# Patient Record
Sex: Male | Born: 1980 | Race: White | Hispanic: No | Marital: Single | Smoking: Former smoker
Health system: Southern US, Community
[De-identification: ages and names within clinical notes are randomized; demographics above are authoritative.]

## PROBLEM LIST (undated history)

## (undated) DIAGNOSIS — F419 Anxiety disorder, unspecified: Secondary | ICD-10-CM

## (undated) HISTORY — DX: Anxiety disorder, unspecified: F41.9

---

## 2011-07-20 ENCOUNTER — Encounter (HOSPITAL_COMMUNITY): Payer: Self-pay | Admitting: Emergency Medicine

## 2011-07-20 ENCOUNTER — Emergency Department (HOSPITAL_COMMUNITY): Payer: Self-pay

## 2011-07-20 ENCOUNTER — Emergency Department (HOSPITAL_COMMUNITY)
Admission: EM | Admit: 2011-07-20 | Discharge: 2011-07-20 | Disposition: A | Discharge status: Home / Self Care | Payer: Self-pay | Attending: Emergency Medicine | Admitting: Emergency Medicine

## 2011-07-20 ENCOUNTER — Emergency Department (HOSPITAL_COMMUNITY)
Admission: EM | Admit: 2011-07-20 | Discharge: 2011-07-20 | Disposition: A | Discharge status: Home Health | Payer: Self-pay | Attending: Emergency Medicine | Admitting: Emergency Medicine

## 2011-07-20 ENCOUNTER — Encounter (HOSPITAL_COMMUNITY): Payer: Self-pay

## 2011-07-20 DIAGNOSIS — R51 Headache: Secondary | ICD-10-CM | POA: Insufficient documentation

## 2011-07-20 DIAGNOSIS — S0003XA Contusion of scalp, initial encounter: Secondary | ICD-10-CM | POA: Insufficient documentation

## 2011-07-20 DIAGNOSIS — S0083XA Contusion of other part of head, initial encounter: Secondary | ICD-10-CM | POA: Insufficient documentation

## 2011-07-20 DIAGNOSIS — W503XXA Accidental bite by another person, initial encounter: Secondary | ICD-10-CM | POA: Insufficient documentation

## 2011-07-20 DIAGNOSIS — F17200 Nicotine dependence, unspecified, uncomplicated: Secondary | ICD-10-CM | POA: Insufficient documentation

## 2011-07-20 DIAGNOSIS — R569 Unspecified convulsions: Secondary | ICD-10-CM | POA: Insufficient documentation

## 2011-07-20 DIAGNOSIS — M25519 Pain in unspecified shoulder: Secondary | ICD-10-CM | POA: Insufficient documentation

## 2011-07-20 LAB — CBC
Hemoglobin: 15.7 g/dL (ref 13.0–17.0)
MCH: 29.6 pg (ref 26.0–34.0)
RBC: 5.3 MIL/uL (ref 4.22–5.81)
WBC: 5.4 10*3/uL (ref 4.0–10.5)

## 2011-07-20 LAB — BASIC METABOLIC PANEL
BUN: 13 mg/dL (ref 6–23)
CO2: 20 mEq/L (ref 19–32)
Chloride: 100 mEq/L (ref 96–112)
GFR calc non Af Amer: >90 mL/min (ref >90)
Glucose, Bld: 205 mg/dL — ABNORMAL HIGH (ref 70–99)
Potassium: 4.7 mEq/L (ref 3.5–5.1)

## 2011-07-20 LAB — RAPID URINE DRUG SCREEN, HOSP PERFORMED
Amphetamines: NOT DETECTED
Cocaine: NOT DETECTED
Opiates: NOT DETECTED
Tetrahydrocannabinol: NOT DETECTED

## 2011-07-20 LAB — URINE MICROSCOPIC-ADD ON

## 2011-07-20 LAB — DIFFERENTIAL
Eosinophils Absolute: 0 10*3/uL (ref 0.0–0.7)
Lymphocytes Relative: 27 % (ref 12–46)
Lymphs Abs: 1.5 10*3/uL (ref 0.7–4.0)
Monocytes Relative: 5 % (ref 3–12)
Neutrophils Relative %: 68 % (ref 43–77)

## 2011-07-20 LAB — URINALYSIS, ROUTINE W REFLEX MICROSCOPIC
Bilirubin Urine: NEGATIVE
Glucose, UA: NEGATIVE mg/dL
Specific Gravity, Urine: 1.03 (ref 1.005–1.030)
pH: 5.5 (ref 5.0–8.0)

## 2011-07-20 MED ORDER — SODIUM CHLORIDE 0.9 % IV BOLUS (SEPSIS)
1000.0000 mL | Freq: Once | INTRAVENOUS | Status: AC
  Administered 2011-07-20: 1000 mL via INTRAVENOUS

## 2011-07-20 MED ORDER — LORAZEPAM 1 MG PO TABS
2.0000 mg | ORAL_TABLET | Freq: Once | ORAL | Status: AC
  Administered 2011-07-20: 2 mg via ORAL
  Filled 2011-07-20: qty 1

## 2011-07-20 MED ORDER — LORAZEPAM 1 MG PO TABS: 2.0000 mg | ORAL_TABLET | Freq: Three times a day (TID) | ORAL | Status: AC

## 2011-07-20 MED ORDER — SODIUM CHLORIDE 0.9 % IV SOLN
Freq: Once | INTRAVENOUS | Status: AC
  Administered 2011-07-20: 1000 mL via INTRAVENOUS

## 2011-07-20 NOTE — ED Notes (Signed)
Pt just d/c from ED, back again due to another seizure. Per ems, pt was postictal and altered loc that lasted a few minutes.  Pt is alert/oriented and does not appear postictal at this time. No fall. Pt was on cough. Bite mark to tongue noted but from previous visit. Pt c/o pain to posterior L shoulder from previous visit also. No fall on previous visit either. States does not know what happened to his arm. Seizure pads placed.

## 2011-07-20 NOTE — ED Notes (Signed)
Pt states he and his dad were cooking breakfast and the next think he knew EMS was there. Per EMS, they were told by pt's dad pt has a " full body seizure " that lasted about 3 min. Pt alert and oriented x 3 on arrival to ED. Area noted to right of pt's tongue that pt chewed

## 2011-07-20 NOTE — ED Provider Notes (Signed)
History   This chart was scribed for Alexander Jakes, MD by Clarita Crane. The patient was seen in room APA15/APA15. Patient's care was started at 0907.    CSN: [id number]  Arrival date & time 07/20/11  0907   First MD Initiated Contact with Patient 07/20/11 [phone]      Chief Complaint  Patient presents with  . Seizures    (Consider location/radiation/quality/duration/timing/severity/associated sxs/prior treatment) HPI Alexander Richmond is a 31 y.o. male who presents to the Emergency Department for evaluation following a moderate to severe episode of a generalized seizure this morning while eating breakfast, approximately 1 hour ago, which has currently resolved. Patient is amnestic to events following onset of seizure.  Patient's father reports patient's seizure lasted approximately 3 minutes before resolving on its own. Father notes patient returned to baseline 5-10 minutes following end of seizure. Patient notes biting tongue during seizure and currently c/o moderate HA. Denies associated vomiting, incontinence.  History reviewed. No pertinent past medical history.  History reviewed. No pertinent past surgical history.  No family history on file.  History  Substance Use Topics  . Smoking status: Current Everyday Smoker    Types: Cigarettes  . Smokeless tobacco: Not on file  . Alcohol Use: No      Review of Systems  Constitutional: Negative for fever and chills.  HENT: Negative for rhinorrhea and neck pain.        Tongue Biting  Eyes: Negative for pain.  Respiratory: Negative for cough and shortness of breath.   Cardiovascular: Negative for chest pain.  Gastrointestinal: Negative for nausea, vomiting, abdominal pain and diarrhea.  Genitourinary: Negative for dysuria.  Musculoskeletal: Negative for back pain.  Skin: Negative for rash.  Neurological: Positive for seizures and headaches. Negative for dizziness and weakness.    Allergies  Review of patient's allergies  indicates no known allergies.  Home Medications  No current outpatient prescriptions on file.  BP 132/82  Pulse 90  Temp(Src) 98.2 F (36.8 C) (Oral)  Resp 20  Ht 5\' 6"  (1.676 m)  Wt 165 lb (74.844 kg)  BMI 26.63 kg/m2  SpO2 96%  Physical Exam  Nursing note and vitals reviewed. Constitutional: He is oriented to person, place, and time. He appears well-developed and well-nourished. No distress.  HENT:  Head: Normocephalic and atraumatic.  Mouth/Throat: Oropharynx is clear and moist.       Evidence of tongue biting.   Eyes: EOM are normal. Pupils are equal, round, and reactive to light.  Neck: Neck supple. No tracheal deviation present.  Cardiovascular: Normal rate and regular rhythm.  Exam reveals no gallop and no friction rub.   No murmur heard. Pulmonary/Chest: Effort normal. No respiratory distress. He has no wheezes. He has no rales.  Abdominal: Soft. Bowel sounds are normal. He exhibits no distension. There is no tenderness.  Musculoskeletal: Normal range of motion. He exhibits no edema.  Neurological: He is alert and oriented to person, place, and time. No cranial nerve deficit or sensory deficit.       No pronator drift.   Skin: Skin is warm and dry.  Psychiatric: He has a normal mood and affect. His behavior is normal.    ED Course  Procedures (including critical care time)  DIAGNOSTIC STUDIES: Oxygen Saturation is 96% on room air, normal by my interpretation.    COORDINATION OF CARE: 9:57AM- Patient informed of current plan for treatment and evaluation and agrees with plan at this time.     Results for orders placed  during the hospital encounter of 07/20/11  CBC      Component Value Range   WBC 5.4  4.0 - 10.5 (K/uL)   RBC 5.30  4.22 - 5.81 (MIL/uL)   Hemoglobin 15.7  13.0 - 17.0 (g/dL)   HCT 46.9  62.9 - 52.8 (%)   MCV 87.7  78.0 - 100.0 (fL)   MCH 29.6  26.0 - 34.0 (pg)   MCHC 33.8  30.0 - 36.0 (g/dL)   RDW 41.3  24.4 - 01.0 (%)   Platelets 242  150  - 400 (K/uL)  DIFFERENTIAL      Component Value Range   Neutrophils Relative 68  43 - 77 (%)   Neutro Abs 3.7  1.7 - 7.7 (K/uL)   Lymphocytes Relative 27  12 - 46 (%)   Lymphs Abs 1.5  0.7 - 4.0 (K/uL)   Monocytes Relative 5  3 - 12 (%)   Monocytes Absolute 0.3  0.1 - 1.0 (K/uL)   Eosinophils Relative 0  0 - 5 (%)   Eosinophils Absolute 0.0  0.0 - 0.7 (K/uL)   Basophils Relative 0  0 - 1 (%)   Basophils Absolute 0.0  0.0 - 0.1 (K/uL)  BASIC METABOLIC PANEL      Component Value Range   Sodium 137  135 - 145 (mEq/L)   Potassium 4.7  3.5 - 5.1 (mEq/L)   Chloride 100  96 - 112 (mEq/L)   CO2 20  19 - 32 (mEq/L)   Glucose, Bld 205 (*) 70 - 99 (mg/dL)   BUN 13  6 - 23 (mg/dL)   Creatinine, Ser 2.72  0.50 - 1.35 (mg/dL)   Calcium 9.9  8.4 - 53.6 (mg/dL)   GFR calc non Af Amer >90  >90 (mL/min)   GFR calc Af Amer >90  >90 (mL/min)  URINALYSIS, ROUTINE W REFLEX MICROSCOPIC      Component Value Range   Color, Urine YELLOW  YELLOW    APPearance CLEAR  CLEAR    Specific Gravity, Urine 1.030  1.005 - 1.030    pH 5.5  5.0 - 8.0    Glucose, UA NEGATIVE  NEGATIVE (mg/dL)   Hgb urine dipstick SMALL (*) NEGATIVE    Bilirubin Urine NEGATIVE  NEGATIVE    Ketones, ur TRACE (*) NEGATIVE (mg/dL)   Protein, ur 30 (*) NEGATIVE (mg/dL)   Urobilinogen, UA 0.2  0.0 - 1.0 (mg/dL)   Nitrite NEGATIVE  NEGATIVE    Leukocytes, UA NEGATIVE  NEGATIVE   URINE MICROSCOPIC-ADD ON      Component Value Range   RBC / HPF 3-6  <3 (RBC/hpf)    Ct Head Wo Contrast  07/20/2011  *RADIOLOGY REPORT*  Clinical Data: 31 year old male with seizure.  CT HEAD WITHOUT CONTRAST  Technique:  Contiguous axial images were obtained from the base of the skull through the vertex without contrast.  Comparison: None.  Findings: Visualized paranasal sinuses and mastoids are clear.  No acute osseous abnormality identified.  The Visualized orbits and scalp soft tissues are within normal limits.  Cerebral volume is within normal limits  for age.  No midline shift, ventriculomegaly, mass effect, evidence of mass lesion, intracranial hemorrhage or evidence of cortically based acute infarction.  Gray-white matter differentiation is within normal limits throughout the brain.  No suspicious intracranial vascular hyperdensity.  IMPRESSION: Normal noncontrast CT appearance of the brain.  Original Report Authenticated By: Harley Hallmark, M.D.     1. Seizure  MDM  By history sounds as if he did truly have a generalized seizure no past history of seizures. Today's head CT and workup negative without any specific findings. Patient will need followup with neurology in the next few days for further workup.      I personally performed the services described in this documentation, which was scribed in my presence. The recorded information has been reviewed and considered.     Alexander Jakes, MD 07/20/11 1150

## 2011-07-20 NOTE — Discharge Instructions (Signed)
Driving and Equipment Restrictions Some medical problems make it dangerous to drive, ride a bike, or use machines. Some of these problems are:  A hard blow to the head (concussion).   Passing out (fainting).   Twitching and shaking (seizures).   Low blood sugar.   Taking medicine to help you relax (sedatives).   Taking pain medicines.   Wearing an eye patch.   Wearing splints. This can make it hard to use parts of your body that you need to drive safely.  HOME CARE   Do not drive until your doctor says it is okay.   Do not use machines until your doctor says it is okay.  You may need a form signed by your doctor (medical release) before you can drive again. You may also need this form before you do other tasks where you need to be fully alert. MAKE SURE YOU:  Understand these instructions.   Will watch your condition.   Will get help right away if you are not doing well or get worse.  Document Released: 06/23/2004 Document Revised: 01/26/2011 Document Reviewed: 09/23/2009 Exit[employer] Patient Information 2012 [employer]. 

## 2011-07-20 NOTE — ED Notes (Signed)
Pt reports was sitting in living room eating breakfast and had seizure.  Pt bit r side of tongue.  Pt denies any seizures in the past.  Pt presently alert and oriented.  Pt c/o slight headache and dry mouth.

## 2011-07-20 NOTE — ED Provider Notes (Signed)
History     CSN: [id number]  Arrival date & time 07/20/11  1505   First MD Initiated Contact with Patient 07/20/11 1520      Chief Complaint  Patient presents with  . Seizures     HPI Pt just d/c from ED, back again due to another seizure. Per ems, pt was postictal and altered loc that lasted a few minutes. Pt is alert/oriented and does not appear postictal at this time. No fall. Pt was on cough. Bite mark to tongue noted but from previous visit. Pt c/o pain to posterior L shoulder from previous visit also. No fall on previous visit either. States does not know what happened to his arm. Seizure pads placed.   History reviewed. No pertinent past medical history.  History reviewed. No pertinent past surgical history.  History reviewed. No pertinent family history.  History  Substance Use Topics  . Smoking status: Current Everyday Smoker    Types: Cigarettes  . Smokeless tobacco: Not on file  . Alcohol Use: No      Review of Systems  All other systems reviewed and are negative.    Allergies  Review of patient's allergies indicates no known allergies.  Home Medications   Current Outpatient Rx  Name Route Sig Dispense Refill  . LORAZEPAM 1 MG PO TABS Oral Take 2 tablets (2 mg total) by mouth every 8 (eight) hours. 30 tablet 0    BP 130/66  Pulse 111  Temp(Src) 99.6 F (37.6 C) (Oral)  Resp 20  SpO2 97%  Physical Exam  Nursing note and vitals reviewed. Constitutional: He is oriented to person, place, and time. He appears well-developed and well-nourished. No distress.  HENT:  Head: Normocephalic and atraumatic.  Mouth/Throat: Oropharynx is clear and moist.       Evidence of tongue biting.   Eyes: EOM are normal. Pupils are equal, round, and reactive to light.  Neck: Neck supple. No tracheal deviation present.  Cardiovascular: Normal rate and regular rhythm.  Exam reveals no gallop and no friction rub.   No murmur heard. Pulmonary/Chest: Effort normal. No  respiratory distress. He has no wheezes. He has no rales.  Abdominal: Soft. Bowel sounds are normal. He exhibits no distension. There is no tenderness.  Musculoskeletal: Normal range of motion. He exhibits no edema.  Neurological: He is alert and oriented to person, place, and time. No cranial nerve deficit or sensory deficit.       No pronator drift.   Skin: Skin is warm and dry.  Psychiatric: He has a normal mood and affect. His behavior is normal.    ED Course  Procedures (including critical care time)  Labs Reviewed  URINE RAPID DRUG SCREEN (HOSP PERFORMED) - Abnormal; Notable for the following:    Benzodiazepines POSITIVE (*)    All other components within normal limits  LAB REPORT - SCANNED   Ct Head Wo Contrast  07/20/2011  *RADIOLOGY REPORT*  Clinical Data: 31 year old male with seizure.  CT HEAD WITHOUT CONTRAST  Technique:  Contiguous axial images were obtained from the base of the skull through the vertex without contrast.  Comparison: None.  Findings: Visualized paranasal sinuses and mastoids are clear.  No acute osseous abnormality identified.  The Visualized orbits and scalp soft tissues are within normal limits.  Cerebral volume is within normal limits for age.  No midline shift, ventriculomegaly, mass effect, evidence of mass lesion, intracranial hemorrhage or evidence of cortically based acute infarction.  Gray-white matter differentiation is within normal  limits throughout the brain.  No suspicious intracranial vascular hyperdensity.  IMPRESSION: Normal noncontrast CT appearance of the brain.  Original Report Authenticated By: Harley Hallmark, M.D.     1. Seizure       MDM  The patient was adamant about not being admitted to the hospital.  I spoke with Dr. Judithann Sheen who recommended he be started on benzodiazepines and that he would see him this week for follow up.        Nelia Shi, MD 07/21/11 [phone]

## 2011-07-20 NOTE — Discharge Instructions (Signed)
Epilepsy People with epilepsy have times when they shake and jerk uncontrollably (seizures). This happens when there is a sudden change in brain function. Epilepsy may have many possible causes. Anything that disturbs the normal pattern of brain cell activity can lead to seizures. HOME CARE   Listen to your doctor about driving and safety during normal activities.   Only take medicine as told by your doctor.   Take blood tests as told by your doctor.   Tell the people you live and work with that you have seizures. Make sure they know how to help you. They should:   Cushion your head and body.   Turn you on your side.   Not restrain you.   Not place anything inside your mouth.   Call for local emergency medical help if there is any question about what has happened.   Write down when your seizures happen and what you remember about each seizure. Write down anything you think may have caused the seizure to happen (trigger).   Keep all follow-up visits with your doctor. This is very important.  GET HELP RIGHT AWAY IF:   You get an infection or start to feel sick. You may have more seizures when you are sick.   You are having seizures more often.   Your seizure pattern is changing.   A seizure does not stop after a few seconds or minutes.   A seizure causes you to have trouble breathing.   A seizure gives you a very bad headache.   A seizure makes you unable to speak or use a part of your body.  MAKE SURE YOU:   Understand these instructions.   Will watch your condition.   Will get help right away if you are not doing well or get worse.  Document Released: 03/13/2009 Document Revised: 01/26/2011 Document Reviewed: 03/13/2009 ExitCare Patient Information 2012 [place], [place].  : Neurology for followup. It is important that you follow up with them so they can do EEG studies and MRI and to determine if you need to be treated with antiseizure meds. Return for recurrent  seizure new or worse symptoms.

## 2012-11-20 IMAGING — CT CT HEAD W/O CM
1 series · 16 of 30 positions shown, 20 images · non-contrast
Comparison: None.

CLINICAL DATA: 30-year-old male with seizure.

CT HEAD WITHOUT CONTRAST
TECHNIQUE: Contiguous axial images were obtained from the base of
the skull through the vertex without contrast.

[Series 2: headtrauma 4.8 h37s · axial · 0.43mm/px · z∈[+49,+177]mm · 16 of 30 slices shown, 20 images]
[im 2/30  brain]
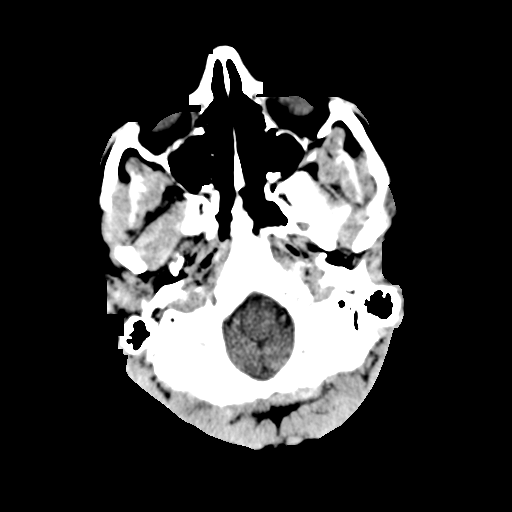
[im 2/30  bone]
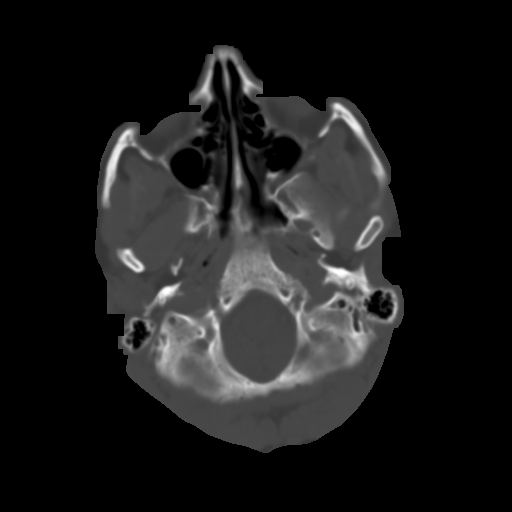
[im 4/30  brain]
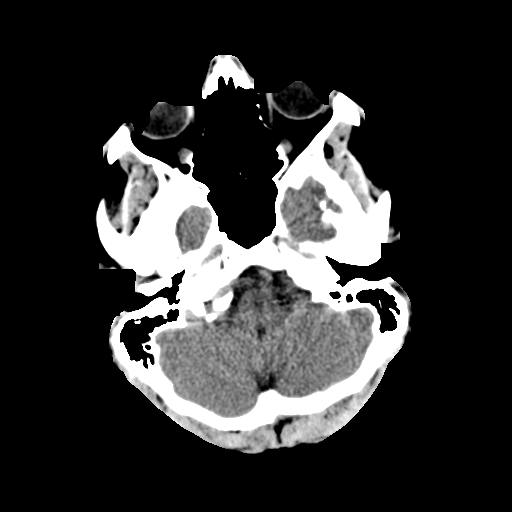
[im 6/30  brain]
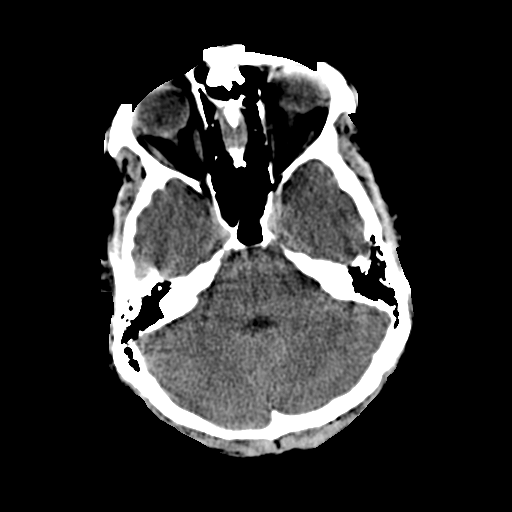
[im 8/30  brain]
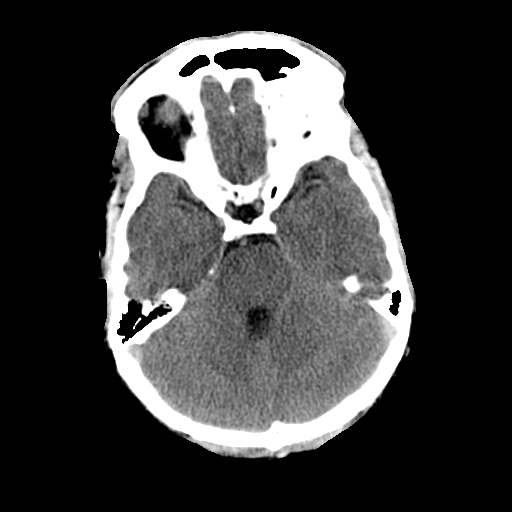
[im 9/30  brain]
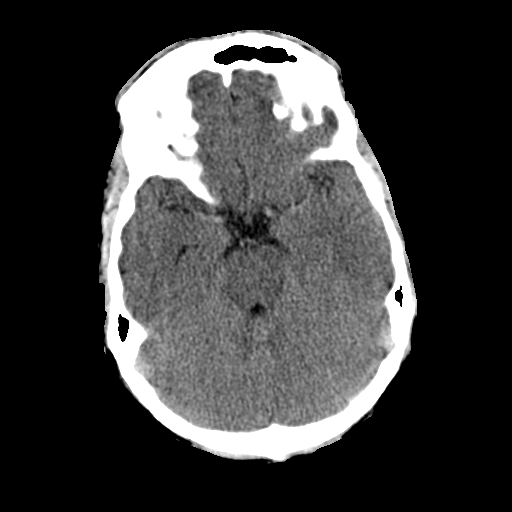
[im 9/30  bone]
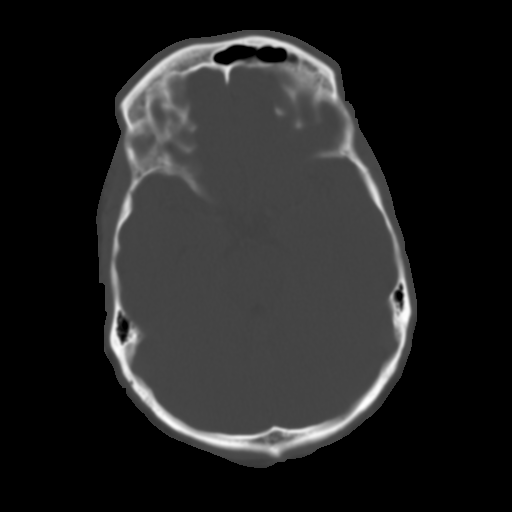
[im 11/30  brain]
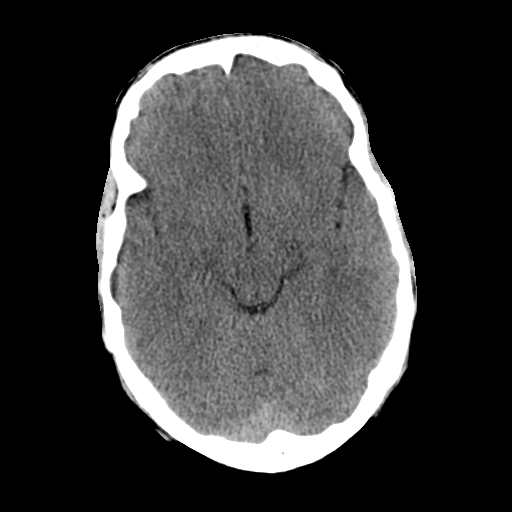
[im 13/30  brain]
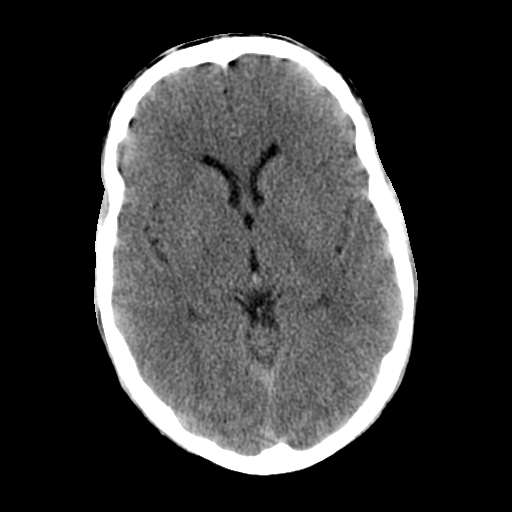
[im 15/30  brain]
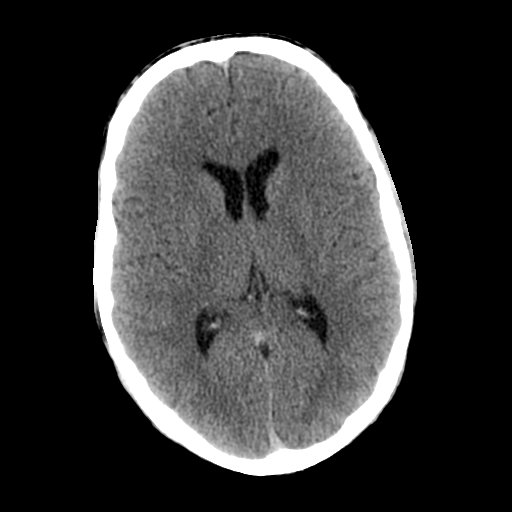
[im 16/30  brain]
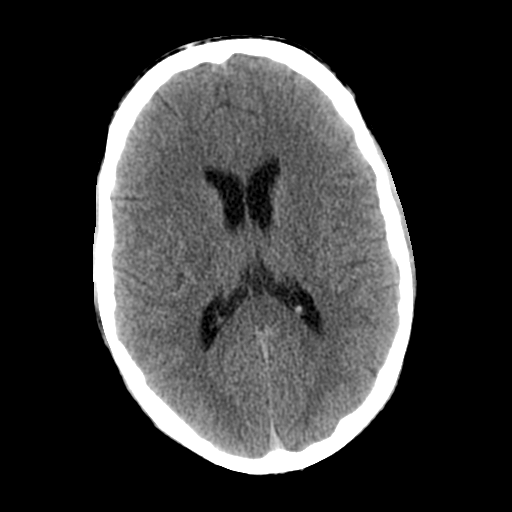
[im 16/30  bone]
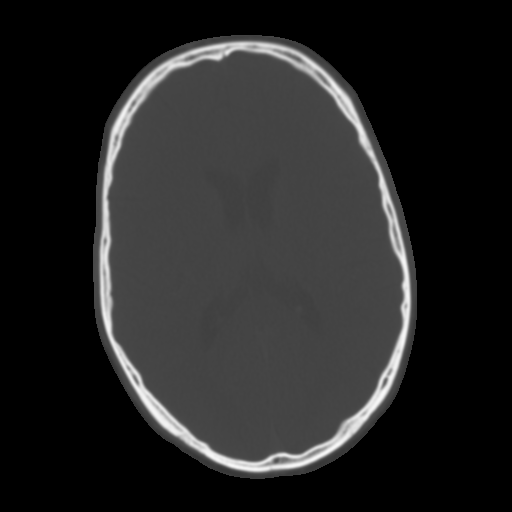
[im 18/30  brain]
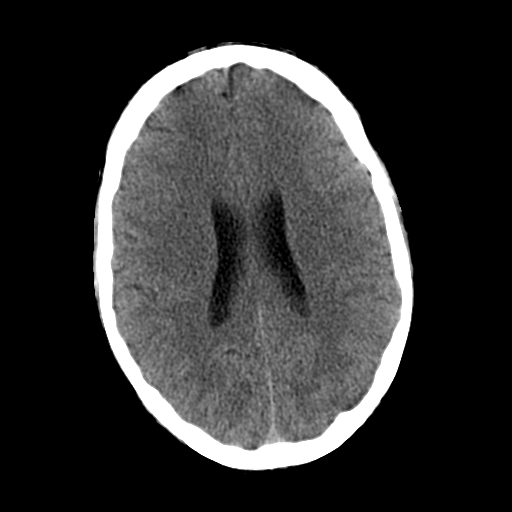
[im 20/30  brain]
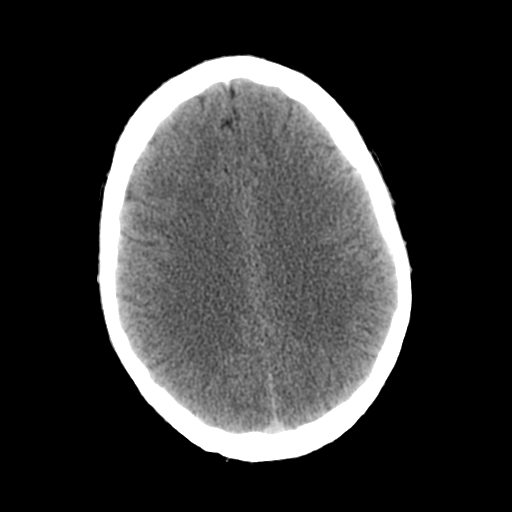
[im 22/30  brain]
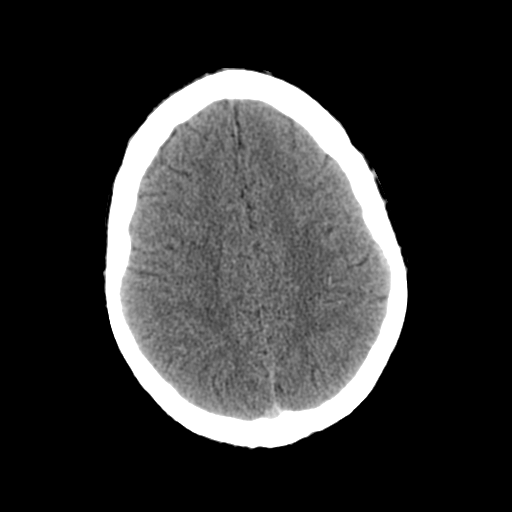
[im 23/30  brain]
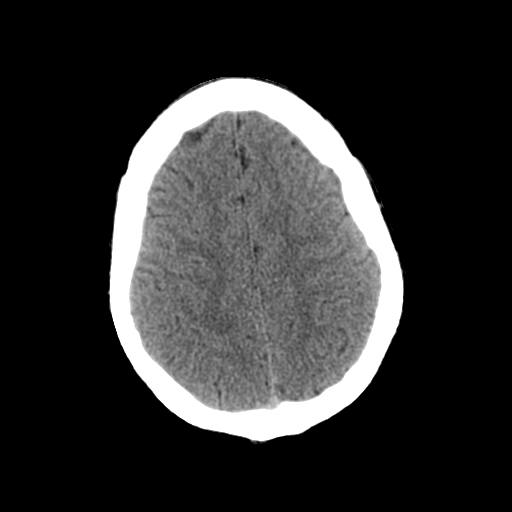
[im 23/30  bone]
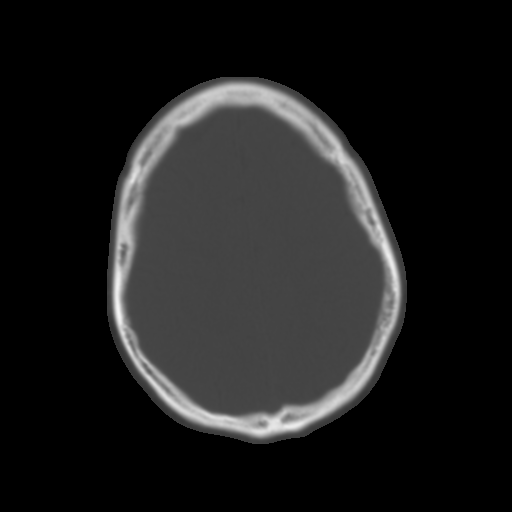
[im 25/30  brain]
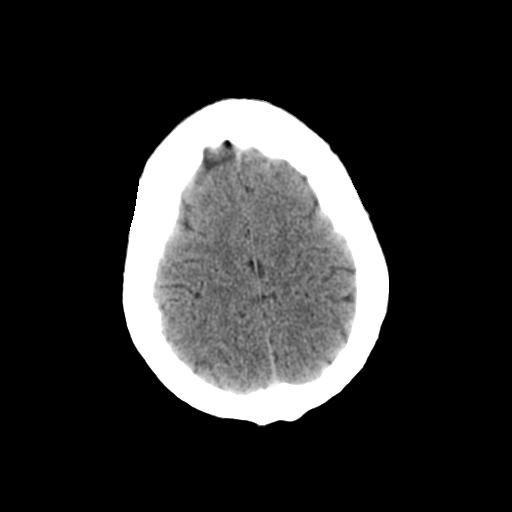
[im 27/30  brain]
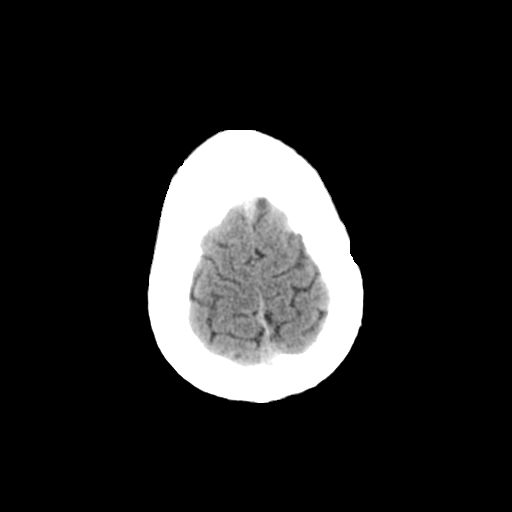
[im 29/30  brain]
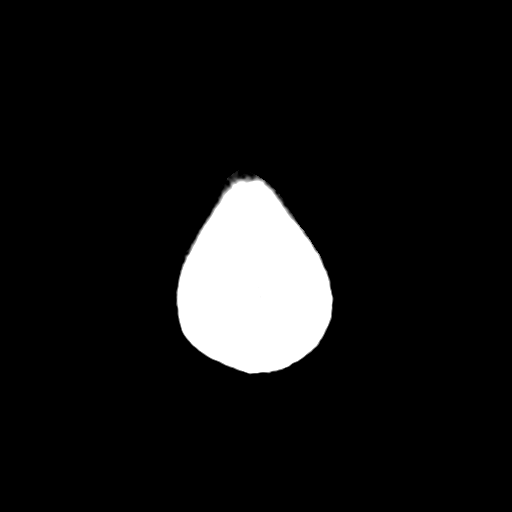

[16 of 30 positions shown; findings below may reference images not displayed]

FINDINGS: Visualized paranasal sinuses and mastoids are clear.  No
acute osseous abnormality identified.  The Visualized orbits and
scalp soft tissues are within normal limits.

Cerebral volume is within normal limits for age.  No midline shift,
ventriculomegaly, mass effect, evidence of mass lesion,
intracranial hemorrhage or evidence of cortically based acute
infarction.  Gray-white matter differentiation is within normal
limits throughout the brain.  No suspicious intracranial vascular
hyperdensity.
IMPRESSION: Normal noncontrast CT appearance of the brain.

## 2013-06-04 ENCOUNTER — Ambulatory Visit (INDEPENDENT_AMBULATORY_CARE_PROVIDER_SITE_OTHER): Payer: BC Managed Care – PPO | Admitting: Emergency Medicine

## 2013-06-04 VITALS — BP 126/78 | HR 95 | Temp 99.7°F | Resp 17 | Ht 67.5 in | Wt 168.0 lb

## 2013-06-04 DIAGNOSIS — J111 Influenza due to unidentified influenza virus with other respiratory manifestations: Secondary | ICD-10-CM

## 2013-06-04 MED ORDER — OSELTAMIVIR PHOSPHATE 75 MG PO CAPS: 75.0000 mg | ORAL_CAPSULE | Freq: Two times a day (BID) | ORAL | Status: DC

## 2013-06-04 MED ORDER — PROMETHAZINE-CODEINE 6.25-10 MG/5ML PO SYRP: 5.0000 mL | ORAL_SOLUTION | Freq: Four times a day (QID) | ORAL | Status: DC | PRN

## 2013-06-04 NOTE — Patient Instructions (Signed)

## 2013-06-04 NOTE — Progress Notes (Signed)
[facility] [address], [place] [address] [phone]  Date:  06/04/2013   Name:  Alexander Richmond   DOB:  08/10/1980   MRN:  [id number]  PCP:  No primary provider on file.    Chief Complaint: Headache, Fever and URI   History of Present Illness:  Alexander Richmond is a 33 y.o. very pleasant male patient who presents with the following:  Ill since Sunday with fever and chills, muscle aches and malaise.  Has a cough and post nasal drip.  Temps to 102.  No nasal drainage.  Father ill with similar.  No nausea or vomiting.  No stool change or rash.  No improvement with over the counter medications or other home remedies. Denies other complaint or health concern today.   There are no active problems to display for this patient.   History reviewed. No pertinent past medical history.  History reviewed. No pertinent past surgical history.  History  Substance Use Topics  . Smoking status: Former Smoker    Types: Cigarettes    Quit date: 03/04/2013  . Smokeless tobacco: Not on file  . Alcohol Use: No    Family History  Problem Relation Age of Onset  . Hyperlipidemia Paternal Grandfather     No Known Allergies  Medication list has been reviewed and updated.  No current outpatient prescriptions on file prior to visit.   No current facility-administered medications on file prior to visit.    Review of Systems:  As per HPI, otherwise negative.    Physical Examination: Filed Vitals:   06/04/13 1425  BP: 126/78  Pulse: 95  Temp: 99.7 F (37.6 C)  Resp: 17   Filed Vitals:   06/04/13 1425  Height: 5' 7.5" (1.715 m)  Weight: 168 lb (76.204 kg)   Body mass index is 25.91 kg/(m^2). Ideal Body Weight: Weight in (lb) to have BMI = 25: 161.7  GEN: WDWN, NAD, Non-toxic, A & O x 3 HEENT: Atraumatic, Normocephalic. Neck supple. No masses, No LAD. Ears and Nose: No external deformity. CV: RRR, No M/G/R. No JVD. No thrill. No extra heart sounds. PULM:  CTA B, no wheezes, crackles, rhonchi. No retractions. No resp. distress. No accessory muscle use. ABD: S, NT, ND, +BS. No rebound. No HSM. EXTR: No c/c/e NEURO Normal gait.  PSYCH: Normally interactive. Conversant. Not depressed or anxious appearing.  Calm demeanor.    Assessment and Plan: Influenza tamiflu Phen c cod  Signed,  Phillips OdorJeffery Caydence Enck, MD

## 2013-06-06 ENCOUNTER — Other Ambulatory Visit: Payer: Self-pay | Admitting: Emergency Medicine

## 2013-06-07 NOTE — Telephone Encounter (Signed)
Taken at max dose pt should be running out sometime today. Do you want to RF?

## 2013-06-21 ENCOUNTER — Ambulatory Visit (INDEPENDENT_AMBULATORY_CARE_PROVIDER_SITE_OTHER): Payer: BC Managed Care – PPO | Admitting: Family Medicine

## 2013-06-21 VITALS — BP 120/64 | HR 88 | Temp 98.4°F | Resp 16 | Ht 66.25 in | Wt 164.8 lb

## 2013-06-21 DIAGNOSIS — Z0000 Encounter for general adult medical examination without abnormal findings: Secondary | ICD-10-CM

## 2013-06-21 DIAGNOSIS — F410 Panic disorder [episodic paroxysmal anxiety] without agoraphobia: Secondary | ICD-10-CM | POA: Insufficient documentation

## 2013-06-21 DIAGNOSIS — Z23 Encounter for immunization: Secondary | ICD-10-CM

## 2013-06-21 LAB — CBC WITH DIFFERENTIAL/PLATELET
BASOS ABS: 0 10*3/uL (ref 0.0–0.1)
Basophils Relative: 0 % (ref 0–1)
Eosinophils Absolute: 0.1 10*3/uL (ref 0.0–0.7)
Eosinophils Relative: 1 % (ref 0–5)
HEMATOCRIT: 45.3 % (ref 39.0–52.0)
HEMOGLOBIN: 15.3 g/dL (ref 13.0–17.0)
LYMPHS ABS: 1.8 10*3/uL (ref 0.7–4.0)
LYMPHS PCT: 39 % (ref 12–46)
MCH: 30.1 pg (ref 26.0–34.0)
MCHC: 33.8 g/dL (ref 30.0–36.0)
MCV: 89 fL (ref 78.0–100.0)
MONO ABS: 0.4 10*3/uL (ref 0.1–1.0)
Monocytes Relative: 9 % (ref 3–12)
NEUTROS ABS: 2.4 10*3/uL (ref 1.7–7.7)
Neutrophils Relative %: 51 % (ref 43–77)
Platelets: 287 10*3/uL (ref 150–400)
RBC: 5.09 MIL/uL (ref 4.22–5.81)
RDW: 12.8 % (ref 11.5–15.5)
WBC: 4.7 10*3/uL (ref 4.0–10.5)

## 2013-06-21 LAB — COMPREHENSIVE METABOLIC PANEL
ALBUMIN: 4.7 g/dL (ref 3.5–5.2)
ALT: 22 U/L (ref 0–53)
AST: 20 U/L (ref 0–37)
Alkaline Phosphatase: 87 U/L (ref 39–117)
BUN: 12 mg/dL (ref 6–23)
CALCIUM: 9.8 mg/dL (ref 8.4–10.5)
CHLORIDE: 104 meq/L (ref 96–112)
CO2: 29 mEq/L (ref 19–32)
Creat: 0.84 mg/dL (ref 0.50–1.35)
Glucose, Bld: 95 mg/dL (ref 70–99)
POTASSIUM: 4.5 meq/L (ref 3.5–5.3)
Sodium: 141 mEq/L (ref 135–145)
Total Bilirubin: 0.4 mg/dL (ref 0.3–1.2)
Total Protein: 7.4 g/dL (ref 6.0–8.3)

## 2013-06-21 LAB — LIPID PANEL
Cholesterol: 146 mg/dL (ref 0–200)
HDL: 50 mg/dL (ref >39)
LDL Cholesterol: 75 mg/dL (ref 0–99)
Total CHOL/HDL Ratio: 2.9 Ratio
Triglycerides: 107 mg/dL (ref <150)
VLDL: 21 mg/dL (ref 0–40)

## 2013-06-21 MED ORDER — ALPRAZOLAM 0.25 MG PO TABS: 0.2500 mg | ORAL_TABLET | Freq: Two times a day (BID) | ORAL | Status: DC | PRN

## 2013-06-21 NOTE — Progress Notes (Signed)
[facility] [address], [place] [address] [phone]  Date:  06/21/2013   Name:  Alexander Richmond   DOB:  07-14-1980   MRN:  [id number]  PCP:  No primary provider on file.    Chief Complaint: Annual Exam   History of Present Illness:  Alexander Richmond is a 33 y.o. very pleasant male patient who presents with the following:  He is here today for a CPE.  He is generally healthy. He was seen about 2 weeks ago with flu like illness. He has recovered.  He has noted some anxiety; for the last year he has noted some sx of anxiety when he has to speak in public, when he has to be around crowds, and in situations wheree "I feel a lot of pressure on me.'  He may notice this a few times a month.  He can sometimes predict when a situation will be too much for him, but sometimes he gets taken by surprise.    He will feel "like I'm having a panic attack.  My chest will get real tight, it will be hard to breathe and I will feel like I can't even talk." he will notice tachycardia and sweating palms.   He does exercise by unloading trucks.  This does not give him any CP.   No history of syncope.   He ex- GF had some xanax that he used to take on occasion; this did help.    Otherwise he is not aware of any other health problems.   He quit smoking a few months ago.  He will occasionally use an e- cigarette and feels better since he made this switch.   There are no active problems to display for this patient.   Past Medical History  Diagnosis Date  . Anxiety     No past surgical history on file.  History  Substance Use Topics  . Smoking status: Former Smoker    Types: Cigarettes    Quit date: 03/04/2013  . Smokeless tobacco: Not on file  . Alcohol Use: No    Family History  Problem Relation Age of Onset  . Hyperlipidemia Paternal Grandfather     No Known Allergies  Medication list has been reviewed and updated.  Current Outpatient Prescriptions on File Prior  to Visit  Medication Sig Dispense Refill  . oseltamivir (TAMIFLU) 75 MG capsule Take 1 capsule (75 mg total) by mouth 2 (two) times daily.  10 capsule  0  . promethazine-codeine (PHENERGAN WITH CODEINE) 6.25-10 MG/5ML syrup Take 5-10 mLs by mouth every 6 (six) hours as needed.  120 mL  0   No current facility-administered medications on file prior to visit.    Review of Systems: As per HPI- otherwise negative.   Physical Examination: Filed Vitals:   06/21/13 0841  BP: 120/64  Pulse: 88  Temp: 98.4 F (36.9 C)  Resp: 16   Filed Vitals:   06/21/13 0841  Height: 5' 6.25" (1.683 m)  Weight: 164 lb 12.8 oz (74.753 kg)   Body mass index is 26.39 kg/(m^2). Ideal Body Weight: Weight in (lb) to have BMI = 25: 155.7  GEN: WDWN, NAD, Non-toxic, A & O x 3, looks well HEENT: Atraumatic, Normocephalic. Neck supple. No masses, No LAD.  Bilateral TM wnl, oropharynx normal.  PEERL,EOMI.   Ears and Nose: No external deformity. CV: RRR, No M/G/R. No JVD. No thrill. No extra heart sounds. PULM: CTA B, no wheezes, crackles, rhonchi. No  retractions. No resp. distress. No accessory muscle use. ABD: S, NT, ND. No rebound. No HSM. EXTR: No c/c/e NEURO Normal gait.  PSYCH: Normally interactive. Conversant. Not depressed or anxious appearing.  Calm demeanor.  GU: normal exam., no masses, discharge or lesions  Assessment and Plan: Physical exam - Plan: CBC with Differential, Comprehensive metabolic panel, Lipid panel, Tdap vaccine greater than or equal to 7yo IM  Panic attacks - Plan: ALPRAZolam (XANAX) 0.25 MG tablet  CPE as above.  He declines STI testing today.  Will plan further follow- up pending labs. Appears to be in good health overall Panic attacks: will try a low dose of PRN xanax.  Cautioned that it he needs to use this medication as lot we may want to change to a BB or SSRI.  He is aware that I expect this rx to last for several months.   Signed Abbe Amsterdam, MD

## 2013-06-21 NOTE — Patient Instructions (Signed)
Good to see you today!  Please set up your mychart account so we can discuss your labs electronically.   Use the xanax as needed for anxiety; be sure to use this medication sparingly as it can be habit forming. It can also make you feel sleepy so do not use it when you need to drive.

## 2013-08-05 ENCOUNTER — Ambulatory Visit (INDEPENDENT_AMBULATORY_CARE_PROVIDER_SITE_OTHER): Payer: BC Managed Care – PPO | Admitting: Family Medicine

## 2013-08-05 VITALS — BP 120/76 | HR 93 | Temp 98.8°F | Resp 16 | Ht 66.0 in | Wt 166.0 lb

## 2013-08-05 DIAGNOSIS — Z23 Encounter for immunization: Secondary | ICD-10-CM

## 2013-08-05 DIAGNOSIS — F410 Panic disorder [episodic paroxysmal anxiety] without agoraphobia: Secondary | ICD-10-CM

## 2013-08-05 DIAGNOSIS — Z7184 Encounter for health counseling related to travel: Secondary | ICD-10-CM

## 2013-08-05 DIAGNOSIS — Z7189 Other specified counseling: Secondary | ICD-10-CM

## 2013-08-05 MED ORDER — DOXYCYCLINE HYCLATE 100 MG PO TABS: ORAL_TABLET | ORAL | Status: DC

## 2013-08-05 MED ORDER — TYPHOID VACCINE PO CPDR: 1.0000 | DELAYED_RELEASE_CAPSULE | ORAL | Status: DC

## 2013-08-05 MED ORDER — ALPRAZOLAM 0.25 MG PO TABS: 0.2500 mg | ORAL_TABLET | Freq: Two times a day (BID) | ORAL | Status: DC | PRN

## 2013-08-05 NOTE — Progress Notes (Signed)
[facility] [address], [place] [id number] [phone]  Date:  08/05/2013   Name:  Alexander Richmond   DOB:  20-Dec-1980   MRN:  [id number]  PCP:  No primary provider on file.    Chief Complaint: Immunizations and Medication Refill   History of Present Illness:  Alexander Richmond is a 33 y.o. 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  He plans to be there for about a month, but may stay longer.   He had hep B series in middle school. Tdap done in January  He would like any recommended immunizations that he can receive today.  Otherwise he is generally healthy  He also needs a RF of his xanax- he takes myabe 4 or 5 in a week.    Patient Active Problem List   Diagnosis Date Noted  . Panic attacks 06/21/2013    Past Medical History  Diagnosis Date  . Anxiety     No past surgical history on file.  History  Substance Use Topics  . Smoking status: Former Smoker    Types: Cigarettes    Quit date: 03/04/2013  . Smokeless tobacco: Not on file  . Alcohol Use: No    Family History  Problem Relation Age of Onset  . Hyperlipidemia Paternal Grandfather     No Known Allergies  Medication list has been reviewed and updated.  Current Outpatient Prescriptions on File Prior to Visit  Medication Sig Dispense Refill  . ALPRAZolam (XANAX) 0.25 MG tablet Take 1 tablet (0.25 mg total) by mouth 2 (two) times daily as needed for anxiety.  30 tablet  0   No current facility-administered medications on file prior to visit.    Review of Systems:  As per HPI- otherwise negative.   Physical Examination: Filed Vitals:   08/05/13 1326  BP: 120/76  Pulse: 93  Temp: 98.8 F (37.1 C)  Resp: 16   Filed Vitals:   08/05/13 1326  Height: 5\' 6"  (1.676 m)  Weight: 166 lb (75.297 kg)   Body mass index is 26.81 kg/(m^2). Ideal Body Weight: Weight in (lb) to have  BMI = 25: 154.6  GEN: WDWN, NAD, Non-toxic, A & O x 3, looks well.   HEENT: Atraumatic, Normocephalic. Neck supple. No masses, No LAD. Ears and Nose: No external deformity. CV: RRR, No M/G/R. No JVD. No thrill. No extra heart sounds. PULM: CTA B, no wheezes, crackles, rhonchi. No retractions. No resp. distress. No accessory muscle use. EXTR: No c/c/e NEURO Normal gait.  PSYCH: Normally interactive. Conversant. Not depressed or anxious appearing.  Calm demeanor.    Assessment and Plan: Travel advice encounter - Plan: doxycycline (VIBRA-TABS) 100 MG tablet, typhoid (VIVOTIF BERNA VACCINE) DR capsule, Hepatitis A vaccine adult IM  Panic attacks - Plan: ALPRAZolam (XANAX) 0.25 MG tablet  Given Hep A #1 today.  Will use vivotif for typhoid and doxycycline for malaria.   See patient instructions for more details.     Signed Abbe AmsterdamJessica Kmari Brian, MD

## 2013-08-05 NOTE — Patient Instructions (Signed)
Start your typhoid oral vaccine right away- take every other day for 4 doses.  Be sure you finish the typhoid a week prior to your trip- and do not start the doxycycline until 3 days after finishing the typhoid.    Take the doxycycline once a day for malaria prophylaxis; start 48 hours prior to your trip and continue for 1 month upon getting home  Be careful and have fun!  Remember your usual safety precautions regarding seatbelts, drugs and alcohol and safer sex.

## 2013-10-16 ENCOUNTER — Other Ambulatory Visit: Payer: Self-pay | Admitting: Family Medicine

## 2013-10-17 ENCOUNTER — Other Ambulatory Visit: Payer: Self-pay | Admitting: Family Medicine

## 2013-10-17 DIAGNOSIS — F410 Panic disorder [episodic paroxysmal anxiety] without agoraphobia: Secondary | ICD-10-CM

## 2013-10-17 MED ORDER — ALPRAZOLAM 0.25 MG PO TABS: 0.2500 mg | ORAL_TABLET | Freq: Two times a day (BID) | ORAL | Status: DC | PRN

## 2013-11-12 ENCOUNTER — Other Ambulatory Visit: Payer: Self-pay | Admitting: Family Medicine

## 2013-11-12 DIAGNOSIS — F410 Panic disorder [episodic paroxysmal anxiety] without agoraphobia: Secondary | ICD-10-CM

## 2013-11-13 MED ORDER — ALPRAZOLAM 0.25 MG PO TABS: 0.2500 mg | ORAL_TABLET | Freq: Two times a day (BID) | ORAL | Status: DC | PRN

## 2013-12-04 ENCOUNTER — Other Ambulatory Visit: Payer: Self-pay | Admitting: Family Medicine

## 2013-12-04 DIAGNOSIS — F410 Panic disorder [episodic paroxysmal anxiety] without agoraphobia: Secondary | ICD-10-CM

## 2013-12-04 MED ORDER — ALPRAZOLAM 0.25 MG PO TABS: 0.2500 mg | ORAL_TABLET | Freq: Two times a day (BID) | ORAL | Status: DC | PRN

## 2013-12-04 NOTE — Telephone Encounter (Signed)
Noted that he is going through his xanax more quickly.  Received #30 6/17 and is now requesting more.  Authorized #[id number], called and LMOM; please give me a call or email with update, noted he has stepped up his use

## 2013-12-06 ENCOUNTER — Encounter: Payer: Self-pay | Admitting: Family Medicine

## 2014-03-11 ENCOUNTER — Ambulatory Visit (INDEPENDENT_AMBULATORY_CARE_PROVIDER_SITE_OTHER): Payer: BC Managed Care – PPO | Admitting: Family Medicine

## 2014-03-11 VITALS — BP 112/70 | HR 74 | Temp 98.0°F | Resp 16 | Ht 67.0 in | Wt 166.0 lb

## 2014-03-11 DIAGNOSIS — R109 Unspecified abdominal pain: Secondary | ICD-10-CM

## 2014-03-11 DIAGNOSIS — M5431 Sciatica, right side: Secondary | ICD-10-CM

## 2014-03-11 LAB — POCT UA - MICROSCOPIC ONLY
Bacteria, U Microscopic: NEGATIVE
Casts, Ur, LPF, POC: NEGATIVE
Crystals, Ur, HPF, POC: NEGATIVE
Mucus, UA: NEGATIVE
RBC, urine, microscopic: NEGATIVE
WBC, Ur, HPF, POC: NEGATIVE
Yeast, UA: NEGATIVE

## 2014-03-11 LAB — POCT URINALYSIS DIPSTICK
Bilirubin, UA: NEGATIVE
Blood, UA: NEGATIVE
Glucose, UA: NEGATIVE
Ketones, UA: NEGATIVE
Leukocytes, UA: NEGATIVE
Nitrite, UA: NEGATIVE
Protein, UA: NEGATIVE
Spec Grav, UA: 1.02
Urobilinogen, UA: 0.2
pH, UA: 5.5

## 2014-03-11 MED ORDER — PREDNISONE 20 MG PO TABS: 40.0000 mg | ORAL_TABLET | Freq: Every day | ORAL | Status: AC

## 2014-03-11 MED ORDER — CYCLOBENZAPRINE HCL 10 MG PO TABS: 10.0000 mg | ORAL_TABLET | Freq: Every day | ORAL | Status: DC

## 2014-03-11 NOTE — Patient Instructions (Signed)
Low Back Sprain with Rehab  A sprain is an injury in which a ligament is torn. The ligaments of the lower back are vulnerable to sprains. However, they are strong and require great force to be injured. These ligaments are important for stabilizing the spinal column. Sprains are classified into three categories. Grade 1 sprains cause pain, but the tendon is not lengthened. Grade 2 sprains include a lengthened ligament, due to the ligament being stretched or partially ruptured. With grade 2 sprains there is still function, although the function may be decreased. Grade 3 sprains involve a complete tear of the tendon or muscle, and function is usually impaired. SYMPTOMS   Severe pain in the lower back.  Sometimes, a feeling of a "pop," "snap," or tear, at the time of injury.  Tenderness and sometimes swelling at the injury site.  Uncommonly, bruising (contusion) within 48 hours of injury.  Muscle spasms in the back. CAUSES  Low back sprains occur when a force is placed on the ligaments that is greater than they can handle. Common causes of injury include:  Performing a stressful act while off-balance.  Repetitive stressful activities that involve movement of the lower back.  Direct hit (trauma) to the lower back. RISK INCREASES WITH:  Contact sports (football, wrestling).  Collisions (major skiing accidents).  Sports that require throwing or lifting (baseball, weightlifting).  Sports involving twisting of the spine (gymnastics, diving, tennis, golf).  Poor strength and flexibility.  Inadequate protection.  Previous back injury or surgery (especially fusion). PREVENTION  Wear properly fitted and padded protective equipment.  Warm up and stretch properly before activity.  Allow for adequate recovery between workouts.  Maintain physical fitness:  Strength, flexibility, and endurance.  Cardiovascular fitness.  Maintain a healthy body weight. PROGNOSIS  If treated  properly, low back sprains usually heal with non-surgical treatment. The length of time for healing depends on the severity of the injury.  RELATED COMPLICATIONS   Recurring symptoms, resulting in a chronic problem.  Chronic inflammation and pain in the low back.  Delayed healing or resolution of symptoms, especially if activity is resumed too soon.  Prolonged impairment.  Unstable or arthritic joints of the low back. TREATMENT  Treatment first involves the use of ice and medicine, to reduce pain and inflammation. The use of strengthening and stretching exercises may help reduce pain with activity. These exercises may be performed at home or with a therapist. Severe injuries may require referral to a therapist for further evaluation and treatment, such as ultrasound. Your caregiver may advise that you wear a back brace or corset, to help reduce pain and discomfort. Often, prolonged bed rest results in greater harm then benefit. Corticosteroid injections may be recommended. However, these should be reserved for the most serious cases. It is important to avoid using your back when lifting objects. At night, sleep on your back on a firm mattress, with a pillow placed under your knees. If non-surgical treatment is unsuccessful, surgery may be needed.  MEDICATION   If pain medicine is needed, nonsteroidal anti-inflammatory medicines (aspirin and ibuprofen), or other minor pain relievers (acetaminophen), are often advised.  Do not take pain medicine for 7 days before surgery.  Prescription pain relievers may be given, if your caregiver thinks they are needed. Use only as directed and only as much as you need.  Ointments applied to the skin may be helpful.  Corticosteroid injections may be given by your caregiver. These injections should be reserved for the most serious cases,   because they may only be given a certain number of times. HEAT AND COLD  Cold treatment (icing) should be applied for 10  to 15 minutes every 2 to 3 hours for inflammation and pain, and immediately after activity that aggravates your symptoms. Use ice packs or an ice massage.  Heat treatment may be used before performing stretching and strengthening activities prescribed by your caregiver, physical therapist, or athletic trainer. Use a heat pack or a warm water soak. SEEK MEDICAL CARE IF:   Symptoms get worse or do not improve in 2 to 4 weeks, despite treatment.  You develop numbness or weakness in either leg.  You lose bowel or bladder function.  Any of the following occur after surgery: fever, increased pain, swelling, redness, drainage of fluids, or bleeding in the affected area.  New, unexplained symptoms develop. (Drugs used in treatment may produce side effects.) EXERCISES  RANGE OF MOTION (ROM) AND STRETCHING EXERCISES - Low Back Sprain Most people with lower back pain will find that their symptoms get worse with excessive bending forward (flexion) or arching at the lower back (extension). The exercises that will help resolve your symptoms will focus on the opposite motion.  Your physician, physical therapist or athletic trainer will help you determine which exercises will be most helpful to resolve your lower back pain. Do not complete any exercises without first consulting with your caregiver. Discontinue any exercises which make your symptoms worse, until you speak to your caregiver. If you have pain, numbness or tingling which travels down into your buttocks, leg or foot, the goal of the therapy is for these symptoms to move closer to your back and eventually resolve. Sometimes, these leg symptoms will get better, but your lower back pain may worsen. This is often an indication of progress in your rehabilitation. Be very alert to any changes in your symptoms and the activities in which you participated in the 24 hours prior to the change. Sharing this information with your caregiver will allow him or her to  most efficiently treat your condition. These exercises may help you when beginning to rehabilitate your injury. Your symptoms may resolve with or without further involvement from your physician, physical therapist or athletic trainer. While completing these exercises, remember:   Restoring tissue flexibility helps normal motion to return to the joints. This allows healthier, less painful movement and activity.  An effective stretch should be held for at least 30 seconds.  A stretch should never be painful. You should only feel a gentle lengthening or release in the stretched tissue. FLEXION RANGE OF MOTION AND STRETCHING EXERCISES: STRETCH - Flexion, Single Knee to Chest   Lie on a firm bed or floor with both legs extended in front of you.  Keeping one leg in contact with the floor, bring your opposite knee to your chest. Hold your leg in place by either grabbing behind your thigh or at your knee.  Pull until you feel a gentle stretch in your low back. Hold __________ seconds.  Slowly release your grasp and repeat the exercise with the opposite side. Repeat __________ times. Complete this exercise __________ times per day.  STRETCH - Flexion, Double Knee to Chest  Lie on a firm bed or floor with both legs extended in front of you.  Keeping one leg in contact with the floor, bring your opposite knee to your chest.  Tense your stomach muscles to support your back and then lift your other knee to your chest. Hold your legs   in place by either grabbing behind your thighs or at your knees.  Pull both knees toward your chest until you feel a gentle stretch in your low back. Hold __________ seconds.  Tense your stomach muscles and slowly return one leg at a time to the floor. Repeat __________ times. Complete this exercise __________ times per day.  STRETCH - Low Trunk Rotation  Lie on a firm bed or floor. Keeping your legs in front of you, bend your knees so they are both pointed toward the  ceiling and your feet are flat on the floor.  Extend your arms out to the side. This will stabilize your upper body by keeping your shoulders in contact with the floor.  Gently and slowly drop both knees together to one side until you feel a gentle stretch in your low back. Hold for __________ seconds.  Tense your stomach muscles to support your lower back as you bring your knees back to the starting position. Repeat the exercise to the other side. Repeat __________ times. Complete this exercise __________ times per day  EXTENSION RANGE OF MOTION AND FLEXIBILITY EXERCISES: STRETCH - Extension, Prone on Elbows   Lie on your stomach on the floor, a bed will be too soft. Place your palms about shoulder width apart and at the height of your head.  Place your elbows under your shoulders. If this is too painful, stack pillows under your chest.  Allow your body to relax so that your hips drop lower and make contact more completely with the floor.  Hold this position for __________ seconds.  Slowly return to lying flat on the floor. Repeat __________ times. Complete this exercise __________ times per day.  RANGE OF MOTION - Extension, Prone Press Ups  Lie on your stomach on the floor, a bed will be too soft. Place your palms about shoulder width apart and at the height of your head.  Keeping your back as relaxed as possible, slowly straighten your elbows while keeping your hips on the floor. You may adjust the placement of your hands to maximize your comfort. As you gain motion, your hands will come more underneath your shoulders.  Hold this position __________ seconds.  Slowly return to lying flat on the floor. Repeat __________ times. Complete this exercise __________ times per day.  RANGE OF MOTION- Quadruped, Neutral Spine   Assume a hands and knees position on a firm surface. Keep your hands under your shoulders and your knees under your hips. You may place padding under your knees for  comfort.  Drop your head and point your tailbone toward the ground below you. This will round out your lower back like an angry cat. Hold this position for __________ seconds.  Slowly lift your head and release your tail bone so that your back sags into a large arch, like an old horse.  Hold this position for __________ seconds.  Repeat this until you feel limber in your low back.  Now, find your "sweet spot." This will be the most comfortable position somewhere between the two previous positions. This is your neutral spine. Once you have found this position, tense your stomach muscles to support your low back.  Hold this position for __________ seconds. Repeat __________ times. Complete this exercise __________ times per day.  STRENGTHENING EXERCISES - Low Back Sprain These exercises may help you when beginning to rehabilitate your injury. These exercises should be done near your "sweet spot." This is the neutral, low-back arch, somewhere between fully rounded   and fully arched, that is your least painful position. When performed in this safe range of motion, these exercises can be used for people who have either a flexion or extension based injury. These exercises may resolve your symptoms with or without further involvement from your physician, physical therapist or athletic trainer. While completing these exercises, remember:   Muscles can gain both the endurance and the strength needed for everyday activities through controlled exercises.  Complete these exercises as instructed by your physician, physical therapist or athletic trainer. Increase the resistance and repetitions only as guided.  You may experience muscle soreness or fatigue, but the pain or discomfort you are trying to eliminate should never worsen during these exercises. If this pain does worsen, stop and make certain you are following the directions exactly. If the pain is still present after adjustments, discontinue the  exercise until you can discuss the trouble with your caregiver. STRENGTHENING - Deep Abdominals, Pelvic Tilt   Lie on a firm bed or floor. Keeping your legs in front of you, bend your knees so they are both pointed toward the ceiling and your feet are flat on the floor.  Tense your lower abdominal muscles to press your low back into the floor. This motion will rotate your pelvis so that your tail bone is scooping upwards rather than pointing at your feet or into the floor. With a gentle tension and even breathing, hold this position for __________ seconds. Repeat __________ times. Complete this exercise __________ times per day.  STRENGTHENING - Abdominals, Crunches   Lie on a firm bed or floor. Keeping your legs in front of you, bend your knees so they are both pointed toward the ceiling and your feet are flat on the floor. Cross your arms over your chest.  Slightly tip your chin down without bending your neck.  Tense your abdominals and slowly lift your trunk high enough to just clear your shoulder blades. Lifting higher can put excessive stress on the lower back and does not further strengthen your abdominal muscles.  Control your return to the starting position. Repeat __________ times. Complete this exercise __________ times per day.  STRENGTHENING - Quadruped, Opposite UE/LE Lift   Assume a hands and knees position on a firm surface. Keep your hands under your shoulders and your knees under your hips. You may place padding under your knees for comfort.  Find your neutral spine and gently tense your abdominal muscles so that you can maintain this position. Your shoulders and hips should form a rectangle that is parallel with the floor and is not twisted.  Keeping your trunk steady, lift your right hand no higher than your shoulder and then your left leg no higher than your hip. Make sure you are not holding your breath. Hold this position for __________ seconds.  Continuing to keep  your abdominal muscles tense and your back steady, slowly return to your starting position. Repeat with the opposite arm and leg. Repeat __________ times. Complete this exercise __________ times per day.  STRENGTHENING - Abdominals and Quadriceps, Straight Leg Raise   Lie on a firm bed or floor with both legs extended in front of you.  Keeping one leg in contact with the floor, bend the other knee so that your foot can rest flat on the floor.  Find your neutral spine, and tense your abdominal muscles to maintain your spinal position throughout the exercise.  Slowly lift your straight leg off the floor about 6 inches for a count   of 15, making sure to not hold your breath.  Still keeping your neutral spine, slowly lower your leg all the way to the floor. Repeat this exercise with each leg __________ times. Complete this exercise __________ times per day. POSTURE AND BODY MECHANICS CONSIDERATIONS - Low Back Sprain Keeping correct posture when sitting, standing or completing your activities will reduce the stress put on different body tissues, allowing injured tissues a chance to heal and limiting painful experiences. The following are general guidelines for improved posture. Your physician or physical therapist will provide you with any instructions specific to your needs. While reading these guidelines, remember:  The exercises prescribed by your provider will help you have the flexibility and strength to maintain correct postures.  The correct posture provides the best environment for your joints to work. All of your joints have less wear and tear when properly supported by a spine with good posture. This means you will experience a healthier, less painful body.  Correct posture must be practiced with all of your activities, especially prolonged sitting and standing. Correct posture is as important when doing repetitive low-stress activities (typing) as it is when doing a single heavy-load  activity (lifting). RESTING POSITIONS Consider which positions are most painful for you when choosing a resting position. If you have pain with flexion-based activities (sitting, bending, stooping, squatting), choose a position that allows you to rest in a less flexed posture. You would want to avoid curling into a fetal position on your side. If your pain worsens with extension-based activities (prolonged standing, working overhead), avoid resting in an extended position such as sleeping on your stomach. Most people will find more comfort when they rest with their spine in a more neutral position, neither too rounded nor too arched. Lying on a non-sagging bed on your side with a pillow between your knees, or on your back with a pillow under your knees will often provide some relief. Keep in mind, being in any one position for a prolonged period of time, no matter how correct your posture, can still lead to stiffness. PROPER SITTING POSTURE In order to minimize stress and discomfort on your spine, you must sit with correct posture. Sitting with good posture should be effortless for a healthy body. Returning to good posture is a gradual process. Many people can work toward this most comfortably by using various supports until they have the flexibility and strength to maintain this posture on their own. When sitting with proper posture, your ears will fall over your shoulders and your shoulders will fall over your hips. You should use the back of the chair to support your upper back. Your lower back will be in a neutral position, just slightly arched. You may place a small pillow or folded towel at the base of your lower back for  support.  When working at a desk, create an environment that supports good, upright posture. Without extra support, muscles tire, which leads to excessive strain on joints and other tissues. Keep these recommendations in mind: CHAIR:  A chair should be able to slide under your desk  when your back makes contact with the back of the chair. This allows you to work closely.  The chair's height should allow your eyes to be level with the upper part of your monitor and your hands to be slightly lower than your elbows. BODY POSITION  Your feet should make contact with the floor. If this is not possible, use a foot rest.  Keep your   ears over your shoulders. This will reduce stress on your neck and low back. INCORRECT SITTING POSTURES  If you are feeling tired and unable to assume a healthy sitting posture, do not slouch or slump. This puts excessive strain on your back tissues, causing more damage and pain. Healthier options include:  Using more support, like a lumbar pillow.  Switching tasks to something that requires you to be upright or walking.  Talking a brief walk.  Lying down to rest in a neutral-spine position. PROLONGED STANDING WHILE SLIGHTLY LEANING FORWARD  When completing a task that requires you to lean forward while standing in one place for a long time, place either foot up on a stationary 2-4 inch high object to help maintain the best posture. When both feet are on the ground, the lower back tends to lose its slight inward curve. If this curve flattens (or becomes too large), then the back and your other joints will experience too much stress, tire more quickly, and can cause pain. CORRECT STANDING POSTURES Proper standing posture should be assumed with all daily activities, even if they only take a few moments, like when brushing your teeth. As in sitting, your ears should fall over your shoulders and your shoulders should fall over your hips. You should keep a slight tension in your abdominal muscles to brace your spine. Your tailbone should point down to the ground, not behind your body, resulting in an over-extended swayback posture.  INCORRECT STANDING POSTURES  Common incorrect standing postures include a forward head, locked knees and/or an excessive  swayback. WALKING Walk with an upright posture. Your ears, shoulders and hips should all line-up. PROLONGED ACTIVITY IN A FLEXED POSITION When completing a task that requires you to bend forward at your waist or lean over a low surface, try to find a way to stabilize 3 out of 4 of your limbs. You can place a hand or elbow on your thigh or rest a knee on the surface you are reaching across. This will provide you more stability, so that your muscles do not tire as quickly. By keeping your knees relaxed, or slightly bent, you will also reduce stress across your lower back. CORRECT LIFTING TECHNIQUES DO :  Assume a wide stance. This will provide you more stability and the opportunity to get as close as possible to the object which you are lifting.  Tense your abdominals to brace your spine. Bend at the knees and hips. Keeping your back locked in a neutral-spine position, lift using your leg muscles. Lift with your legs, keeping your back straight.  Test the weight of unknown objects before attempting to lift them.  Try to keep your elbows locked down at your sides in order get the best strength from your shoulders when carrying an object.  Always ask for help when lifting heavy or awkward objects. INCORRECT LIFTING TECHNIQUES DO NOT:   Lock your knees when lifting, even if it is a small object.  Bend and twist. Pivot at your feet or move your feet when needing to change directions.  Assume that you can safely pick up even a paperclip without proper posture. Document Released: 05/16/2005 Document Revised: 08/08/2011 Document Reviewed: 08/28/2008 [employer] Patient Information 2015 [employer]. This information is not intended to replace advice given to you by your health care provider. Make sure you discuss any questions you have with your health care provider.  

## 2014-03-11 NOTE — Progress Notes (Signed)
This chart was scribed for Alexander SidleKurt Lauenstein, MD by Marica OtterNusrat Rahman, ED Scribe at [facility]. This patient was seen in room Room 14 and the patient's care was started at 11:45 AM.  Patient ID: Alexander GeneralRussell Verbeek MRN: [id number], DOB: [birth year], 33 y.o. Date of Encounter: 03/11/2014, 11:45 AM  Primary Physician: No PCP Per Patient  Chief Complaint  Patient presents with   Back Pain    x3 days;  trouble sleeping; getting worse; more pain on right side; bending over makes pain worse     HPI: 33 y.o. year old male with history below and significant for chronic back pain, presents with constant right flank pain onset 3 days ago. Pt reports that the pain began after he slept on his couch. Pt states that the pain worsens throughout the day. Bending over or straightening his right leg intensifies the pain. Pt denies nausea.   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    Past Medical History  Diagnosis Date   Anxiety      Home Meds: Prior to Admission medications   Medication Sig Start Date End Date Taking? Authorizing Provider  ALPRAZolam (XANAX) 0.25 MG tablet Take 1 tablet (0.25 mg total) by mouth 2 (two) times daily as needed for anxiety. 12/04/13   Pearline CablesJessica C Copland, MD    Allergies: No Known Allergies  History   Social History   Marital Status: Single    Spouse Name: Alexander Richmond    Number of Children: Alexander Richmond   Years of Education: Alexander Richmond   Occupational History   Not on file.   Social History Main Topics   Smoking status: Former Smoker    Types: Cigarettes    Quit date: 03/04/2013   Smokeless tobacco: Not on file   Alcohol Use: No   Drug Use: No   Sexual Activity: No   Other Topics Concern   Not on file   Social History Narrative   No narrative on file     Review of Systems: Constitutional: negative for chills, fever,  night sweats, weight changes, or fatigue  HEENT: negative for vision changes, hearing loss, congestion, rhinorrhea, ST, epistaxis, or sinus pressure Cardiovascular: negative for chest pain or palpitations Respiratory: negative for hemoptysis, wheezing, shortness of breath, or cough Abdominal: negative for abdominal pain, nausea, vomiting, diarrhea, or constipation Dermatological: negative for rash Neurologic: negative for headache, dizziness, or syncope All other systems reviewed and are otherwise negative with the exception to those above and in the HPI. Musc: Positive for Right Flank Pain    Physical Exam: Blood pressure 112/70, pulse 74, temperature 98 F (36.7 C), temperature source Oral, resp. rate 16, height 5\' 7"  (1.702 m), weight 166 lb (75.297 kg), SpO2 100.00%., Body mass index is 25.99 kg/(m^2). [place]: Well developed, well nourished, in no acute distress. Head: Normocephalic, atraumatic, eyes without discharge, sclera non-icteric, nares are without discharge. Bilateral auditory canals clear, TM's are without perforation, pearly grey and translucent with reflective cone of light bilaterally. Oral cavity moist, posterior pharynx without exudate, erythema, peritonsillar abscess, or post nasal drip.  Neck: Supple. No thyromegaly. Full ROM. No lymphadenopathy. Lungs: Clear bilaterally to auscultation without wheezes, rales, or rhonchi. Breathing is unlabored. Heart: RRR with S1 S2. No murmurs, rubs, or gallops appreciated. Abdomen: Soft, non-tender, non-distended with normoactive bowel sounds. No hepatomegaly. No rebound/guarding. No obvious abdominal  masses. Msk:  Strength and tone normal for age. Positive straight leg raise on the right leg. Back is non tender, no scoliosis, pain when bending over. Normal reflexes.  Extremities/Skin: Warm and dry. No clubbing or cyanosis. No edema. No rashes or suspicious lesions. Neuro: Alert and oriented X 3. Moves all extremities spontaneously. Gait  is normal. CNII-XII grossly in tact. Psych:  Responds to questions appropriately with a normal affect.   Labs: Results for orders placed in visit on 03/11/14  POCT UA - MICROSCOPIC ONLY      Result Value Ref Range   WBC, Ur, HPF, POC neg     RBC, urine, microscopic neg     Bacteria, U Microscopic neg     Mucus, UA neg     Epithelial cells, urine per micros 0-1     Crystals, Ur, HPF, POC neg     Casts, Ur, LPF, POC neg     Yeast, UA neg     Renal tubular cells      POCT URINALYSIS DIPSTICK      Result Value Ref Range   Color, UA yellow     Clarity, UA clear     Glucose, UA negative     Bilirubin, UA negative     Ketones, UA negative     Spec Grav, UA 1.020     Blood, UA negative     pH, UA 5.5     Protein, UA negative     Urobilinogen, UA 0.2     Nitrite, UA negative     Leukocytes, UA Negative        ASSESSMENT AND PLAN:  DIAGNOSTIC STUDIES: Oxygen Saturation is 100% on RA, nl by my interpretation.    COORDINATION OF CARE: 11:51 AM-Discussed treatment plan which includes UA with pt at bedside and pt agreed to plan.   33 y.o. year old male with Right flank pain - Plan: POCT UA - Microscopic Only, POCT urinalysis dipstick, cyclobenzaprine (FLEXERIL) 10 MG tablet  Sciatica, right - Plan: POCT UA - Microscopic Only, POCT urinalysis dipstick, predniSONE (DELTASONE) 20 MG tablet, cyclobenzaprine (FLEXERIL) 10 MG tablet   Signed, Alexander SidleKurt Lauenstein, MD 03/11/2014 11:45 AM  I personally performed the services described in this documentation, which was scribed in my presence. The recorded information has been reviewed and is accurate.

## 2014-03-23 ENCOUNTER — Other Ambulatory Visit: Payer: Self-pay | Admitting: Family Medicine

## 2014-03-23 DIAGNOSIS — M5431 Sciatica, right side: Secondary | ICD-10-CM

## 2014-03-23 DIAGNOSIS — R109 Unspecified abdominal pain: Secondary | ICD-10-CM

## 2014-03-25 MED ORDER — CYCLOBENZAPRINE HCL 10 MG PO TABS: 10.0000 mg | ORAL_TABLET | Freq: Every day | ORAL | Status: DC

## 2014-03-25 NOTE — Telephone Encounter (Signed)
Dr L, do you want to give pt RFs?

## 2014-04-11 ENCOUNTER — Other Ambulatory Visit: Payer: Self-pay | Admitting: Family Medicine

## 2014-04-11 DIAGNOSIS — F410 Panic disorder [episodic paroxysmal anxiety] without agoraphobia: Secondary | ICD-10-CM

## 2014-04-12 MED ORDER — ALPRAZOLAM 0.25 MG PO TABS: 0.2500 mg | ORAL_TABLET | Freq: Two times a day (BID) | ORAL | Status: DC | PRN

## 2014-06-23 ENCOUNTER — Other Ambulatory Visit: Payer: Self-pay | Admitting: Family Medicine

## 2014-06-23 DIAGNOSIS — F410 Panic disorder [episodic paroxysmal anxiety] without agoraphobia: Secondary | ICD-10-CM

## 2014-06-24 ENCOUNTER — Other Ambulatory Visit: Payer: Self-pay | Admitting: Family Medicine

## 2014-06-24 MED ORDER — ALPRAZOLAM 0.25 MG PO TABS: 0.2500 mg | ORAL_TABLET | Freq: Two times a day (BID) | ORAL | Status: DC | PRN

## 2014-08-25 ENCOUNTER — Other Ambulatory Visit: Payer: Self-pay | Admitting: Family Medicine

## 2014-08-25 DIAGNOSIS — F410 Panic disorder [episodic paroxysmal anxiety] without agoraphobia: Secondary | ICD-10-CM

## 2014-08-26 MED ORDER — ALPRAZOLAM 0.25 MG PO TABS: 0.2500 mg | ORAL_TABLET | Freq: Two times a day (BID) | ORAL | Status: AC | PRN

## 2014-09-09 ENCOUNTER — Encounter: Payer: Self-pay | Admitting: Family Medicine
